# Patient Record
Sex: Male | Born: 1980 | Race: Black or African American | Hispanic: No | Marital: Single | Smoking: Current every day smoker
Health system: Southern US, Community
[De-identification: ages and names within clinical notes are randomized; demographics above are authoritative.]

## PROBLEM LIST (undated history)

## (undated) DIAGNOSIS — M25561 Pain in right knee: Secondary | ICD-10-CM

## (undated) DIAGNOSIS — G8929 Other chronic pain: Secondary | ICD-10-CM

---
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
## 2014-02-15 ENCOUNTER — Emergency Department (HOSPITAL_BASED_OUTPATIENT_CLINIC_OR_DEPARTMENT_OTHER): Payer: Self-pay

## 2014-02-15 ENCOUNTER — Encounter (HOSPITAL_BASED_OUTPATIENT_CLINIC_OR_DEPARTMENT_OTHER): Payer: Self-pay | Admitting: *Deleted

## 2014-02-15 ENCOUNTER — Emergency Department (HOSPITAL_BASED_OUTPATIENT_CLINIC_OR_DEPARTMENT_OTHER)
Admission: EM | Admit: 2014-02-15 | Discharge: 2014-02-15 | Disposition: A | Discharge status: Home / Self Care | Payer: Self-pay | Attending: Emergency Medicine | Admitting: Emergency Medicine

## 2014-02-15 DIAGNOSIS — M25561 Pain in right knee: Secondary | ICD-10-CM

## 2014-02-15 DIAGNOSIS — Y998 Other external cause status: Secondary | ICD-10-CM | POA: Insufficient documentation

## 2014-02-15 DIAGNOSIS — Z720 Tobacco use: Secondary | ICD-10-CM | POA: Insufficient documentation

## 2014-02-15 DIAGNOSIS — X58XXXA Exposure to other specified factors, initial encounter: Secondary | ICD-10-CM | POA: Insufficient documentation

## 2014-02-15 DIAGNOSIS — Y9289 Other specified places as the place of occurrence of the external cause: Secondary | ICD-10-CM | POA: Insufficient documentation

## 2014-02-15 DIAGNOSIS — S8391XA Sprain of unspecified site of right knee, initial encounter: Secondary | ICD-10-CM | POA: Insufficient documentation

## 2014-02-15 DIAGNOSIS — Y9389 Activity, other specified: Secondary | ICD-10-CM | POA: Insufficient documentation

## 2014-02-15 MED ORDER — HYDROCODONE-ACETAMINOPHEN 5-325 MG PO TABS: 1.0000 | ORAL_TABLET | Freq: Four times a day (QID) | ORAL | Status: DC | PRN

## 2014-02-15 MED ORDER — HYDROCODONE-ACETAMINOPHEN 5-325 MG PO TABS
2.0000 | ORAL_TABLET | Freq: Once | ORAL | Status: AC
  Administered 2014-02-15: 2 via ORAL
  Filled 2014-02-15: qty 2

## 2014-02-15 NOTE — ED Notes (Signed)
Pt sts he threw his right knee out of place this morning when stepping down out of a truck.

## 2014-02-15 NOTE — Discharge Instructions (Signed)
1. Medications: vicodin, usual home medications 2. Treatment: rest, drink plenty of fluids, ice, elevate, use crutches as needed, wear brace 3. Follow Up: Please followup with Dr. Pearletha ForgeHudnall in one week if symptoms persist for discussion of your diagnoses and further evaluation after today's visit; if you do not have a primary care doctor use the resource guide provided to find one; Please return to the ER for weakness, numbness, inability to bear weight on the leg.    Arthralgia Your caregiver has diagnosed you as suffering from an arthralgia. Arthralgia means there is pain in a joint. This can come from many reasons including:  Bruising the joint which causes soreness (inflammation) in the joint.  Wear and tear on the joints which occur as we grow older (osteoarthritis).  Overusing the joint.  Various forms of arthritis.  Infections of the joint. Regardless of the cause of pain in your joint, most of these different pains respond to anti-inflammatory drugs and rest. The exception to this is when a joint is infected, and these cases are treated with antibiotics, if it is a bacterial infection. HOME CARE INSTRUCTIONS   Rest the injured area for as long as directed by your caregiver. Then slowly start using the joint as directed by your caregiver and as the pain allows. Crutches as directed may be useful if the ankles, knees or hips are involved. If the knee was splinted or casted, continue use and care as directed. If an stretchy or elastic wrapping bandage has been applied today, it should be removed and re-applied every 3 to 4 hours. It should not be applied tightly, but firmly enough to keep swelling down. Watch toes and feet for swelling, bluish discoloration, coldness, numbness or excessive pain. If any of these problems (symptoms) occur, remove the ace bandage and re-apply more loosely. If these symptoms persist, contact your caregiver or return to this location.  For the first 24 hours,  keep the injured extremity elevated on pillows while lying down.  Apply ice for 15-20 minutes to the sore joint every couple hours while awake for the first half day. Then 03-04 times per day for the first 48 hours. Put the ice in a plastic bag and place a towel between the bag of ice and your skin.  Wear any splinting, casting, elastic bandage applications, or slings as instructed.  Only take over-the-counter or prescription medicines for pain, discomfort, or fever as directed by your caregiver. Do not use aspirin immediately after the injury unless instructed by your physician. Aspirin can cause increased bleeding and bruising of the tissues.  If you were given crutches, continue to use them as instructed and do not resume weight bearing on the sore joint until instructed. Persistent pain and inability to use the sore joint as directed for more than 2 to 3 days are warning signs indicating that you should see a caregiver for a follow-up visit as soon as possible. Initially, a hairline fracture (break in bone) may not be evident on X-rays. Persistent pain and swelling indicate that further evaluation, non-weight bearing or use of the joint (use of crutches or slings as instructed), or further X-rays are indicated. X-rays may sometimes not show a small fracture until a week or 10 days later. Make a follow-up appointment with your own caregiver or one to whom we have referred you. A radiologist (specialist in reading X-rays) may read your X-rays. Make sure you know how you are to obtain your X-ray results. Do not assume everything is  normal if you do not hear from [place]. SEEK MEDICAL CARE IF: Bruising, swelling, or pain increases. SEEK IMMEDIATE MEDICAL CARE IF:   Your fingers or toes are numb or blue.  The pain is not responding to medications and continues to stay the same or get worse.  The pain in your joint becomes severe.  You develop a fever over 102 F (38.9 C).  It becomes impossible to  move or use the joint. MAKE SURE YOU:   Understand these instructions.  Will watch your condition.  Will get help right away if you are not doing well or get worse. Document Released: 12/23/2004 Document Revised: 03/17/2011 Document Reviewed: 08/11/2007 [facility] Patient Information 2015 [place], [place]. This information is not intended to replace advice given to you by your health care provider. Make sure you discuss any questions you have with your health care provider.   Emergency Department Resource Guide 1) Find a Doctor and Pay Out of Pocket Although you won't have to find out who is covered by your insurance plan, it is a good idea to ask around and get recommendations. You will then need to call the office and see if the doctor you have chosen will accept you as a new patient and what types of options they offer for patients who are self-pay. Some doctors offer discounts or will set up payment plans for their patients who do not have insurance, but you will need to ask so you aren't surprised when you get to your appointment.  2) Contact Your Local Health Department Not all health departments have doctors that can see patients for sick visits, but many do, so it is worth a call to see if yours does. If you don't know where your local health department is, you can check in your phone book. The CDC also has a tool to help you locate your state's health department, and many state websites also have listings of all of their local health departments.  3) Find a Walk-in Clinic If your illness is not likely to be very severe or complicated, you may want to try a walk in clinic. These are popping up all over the country in pharmacies, drugstores, and shopping centers. They're usually staffed by nurse practitioners or physician assistants that have been trained to treat common illnesses and complaints. They're usually fairly quick and inexpensive. However, if you have serious medical issues or  chronic medical problems, these are probably not your best option.  No Primary Care Doctor: - Call Health Connect at  [phone] - they can help you locate a primary care doctor that  accepts your insurance, provides certain services, etc. - Physician Referral Service- [phone]  Chronic Pain Problems: Organization         Address  Phone   Notes  [facility]  [phone] Patients need to be referred by their primary care doctor.   Medication Assistance: Organization         Address  Phone   Notes  [facility] [address] [place], [place] [address] [phone] --Must be a resident of [facility] -- Must have NO insurance coverage whatsoever (no Medicaid/ Medicare, etc.) -- The pt. MUST have a primary care doctor that directs their care regularly and follows them in the community   [facility]  [phone]   [employer]  [phone]    Agencies that provide inexpensive medical care: Organization  Address  Phone   Notes  [facility]  [phone]   [facility]    [phone]   [facility] [address] [place], [place] [id number] [phone]   [facility] [address], [place] [phone]) [phone]   [facility]    [phone]   [facility]    [phone]   [facility]  [address], [place] Phone:  [phone], Fax:  [phone]) [phone] Hours of Operation:  9 am - 6 pm, M-F.  Also accepts Medicaid/Medicare and self-pay.  [facility]  [address], [place] Phone: [phone], Fax: [phone](336) [phone]. Hours of Operation:  8:30 am - 5:30 pm, M-F.  Also accepts Medicaid and self-pay.  [facility] [place] [address], [place] Phone: [phone]) [phone]   [facility] [address], [place], [place]  [phone], Ext. [phone] Mondays & Thursdays: 7-9 AM.  First 15 patients are seen on a first come, first serve basis.    Medicaid-accepting [facility] Providers:  Organization         Address  Phone   Notes  [facility] [address], [place] [phone] Also accepts self-pay patients.  [facility] [address], [place]  [phone]) [phone]   [facility] [address], [place] [phone]) [phone]   [facility] [address] [place] [address], [place] [phone]   Renaye RakersVeita Bland [address], [place]   [phone] Only accepts [place] Access [place] patients after they have their name applied to their card.   Self-Pay (no insurance) in [facility]:  Organization         Address  Phone   Notes  Sickle Cell Patients, [facility] [address], [place] [phone]   [facility] [address], [place] [phone]) [phone]   [facility] [place]  [address], [place] [phone]) [phone]   [facility]/Dr. Osei-Bonsu  [address] [place] [address], [place] or [phone], [place] [phone] Phone number for both [place] and [place] locations is the same.  [facility] [address], [place] [phone]   [facility] [address] [place] [address], [place] or [address] [phone]   [facility] [address], [place] [phone]) [phone], phone; [phone], fax Sees patients 1st and 3rd Saturday of every month.  Must not qualify for public or private insurance (i.e. Medicaid, Medicare, [place] Health Choice, Veterans' Benefits)  Household income should be no more than 200% of the poverty level The clinic cannot treat you if you are pregnant or think you are pregnant  Sexually transmitted  diseases are not treated at the clinic.    Dental Care: Organization         Address  Phone  Notes  [facility] of [facility] [address], [place] [phone] Accepts children up to age 34 who are enrolled in [place] or [place] Health Choice; pregnant women with a Medicaid card; and children who have applied for Medicaid or [place] Health Choice, but were declined, whose parents can pay a reduced fee at time  of service.  [facility] Department of [facility]  [address], [place] [phone] Accepts children up to age 66 who are enrolled in [place] or [place] Health Choice; pregnant women with a Medicaid card; and children who have applied for Medicaid or [place] Health Choice, but were declined, whose parents can pay a reduced fee at time of service.  [facility]  [address] [place], [place] [phone] Patients are seen by appointment only. Walk-ins are not accepted. [facility] will see patients 83 years of age and older. Monday - Tuesday (8am-5pm) Most Wednesdays (8:30-5pm) $30 per visit, cash only  [facility]  [address], [facility] [phone] Patients are seen by appointment only. Walk-ins are not accepted. [facility] will see patients 76 years of age and older. One Wednesday Evening (Monthly: Volunteer Based).  $30 per visit, cash only  [employer]  [phone] for adults; Children under age 7, call Graduate Pediatric Dentistry at [phone]. Children aged 64-14, please call [phone] to request a pediatric application.  Dental services are provided in all areas of dental care including fillings, crowns and bridges, complete and partial dentures, implants, gum treatment, root canals, and extractions. Preventive care is also provided. Treatment is provided to both adults and children. Patients are selected via a  lottery and there is often a waiting list.   [facility] [address], [place]  [phone] [email or web address]   [facility] [address] [place], [place] [phone], Ext. [phone] Second and Fourth Thursday of each month, opens at 6:30 AM; Clinic ends at 9 AM.  Patients are seen on a first-come first-served basis, and a limited number are seen during each clinic.   [facility]  [address] [place] [place], [place] [phone]   Eligibility Requirements You must have lived in [place], [place], or [place] counties for at least the last three months.   You cannot be eligible for state or federal sponsored [place], including [employer], [place], or [employer].   You generally cannot be eligible for healthcare insurance through your employer.    How to apply: Eligibility screenings are held every Tuesday and Wednesday afternoon from 1:00 pm until 4:00 pm. You do not need an appointment for the interview!  [facility] [address], [place], [place] [phone]   [facility] Health Department  [phone]   [facility] Health Department  [phone]   [facility] Health Department  [phone]    Behavioral Health Resources in the Community: Intensive Outpatient Programs Organization         Address  Phone  Notes  [facility] [address] [place], [place] [phone]   [facility] [address], [place], [place] [phone]   [facility] [address] [place], [place]  [phone]   [facility] [address]  [place], [place] [phone] or [phone]   Substance Abuse Resources Organization         Address  Phone  Notes  [facility]  [phone]   [facility]  [phone]   [facility] [place]  [phone]   Floydene Flock  [phone]   Residential &  Outpatient Substance Abuse Program  [phone]   Psychological Services Organization         Address  Phone  Notes  °[facility]  336- [phone]   °[facility]  336- [phone]   °[facility] [address], [place] 1-800-853-5163 or 336-641-4981   ° °Mobile Crisis Teams °Organization         Address  Phone  Notes  °[facility], Mobile Crisis Care Unit  1-877-626-1772   °Assertive °[facility]otherapeutic Services ° [address] [place], [place] [phone]   °Sharon DeEsch [address] °[place] [phone]   ° °Self-Help/Support Groups °Organization         Address  Phone             Notes  °[facility] [place] - variety of support groups  336- 373-1402 Call for more information  °Narcotics Anonymous (NA), [facility] [address], °[place]  2 meetings at this location  ° °Residential Treatment Programs °Organization         Address  Phone  Notes  °[facility] [address],    °[place]  1-866-801-8205   °[facility] ° [address], [place], [place] [phone]   °[facility] [address], [place] [phone] Admissions: 8am-3pm M-F  °[facility] [address]    °[place], [place] [phone]   °[facility] [address], [place], [place] [phone]   °[facility] [address]  °[place], [place] 336-285-9073   °[facility] - Intensive Outpatient [address], , [place] 336-852-3033   °[facility] [address]  °[place], [place] 1-877-615-2722 or 336-784-9470   °[facility] ([facility]) [address] [place], [place] 336-227-7417 Accepts Medicaid  °[facility] [address]  ° [place] 1-800-659-3381 Substance Abuse/Addiction Treatment  ° °[place] Behavioral Health Resources °Organization          Address  Phone  Notes  °[facility]  (888) [phone]   °Julie Brannon, PhD [address] [place], [place]   (336) [phone] or (336) 951-0000   °[facility]   [address] °[place], [place] (336) 349-4454   °[facility] [address], [place], [place] (336) [phone] Insurance/Medicaid/sponsorship through [facility]  °[facility] [address]                                    [place], [place] (336) [phone] Therapy/tele-psych/case  °[facility] [address]  ° [place], [place] (336) [phone]    °Dr. Arfeen  (336) 349-4544   °[facility] [place]  [employer] [place] [facility] 1) [address], [place] °2) [address], [place] °3)  [address], [place] (336) [phone] °(336) 342-7768 ° °(336) 342-8140   °[place] Child Abuse Hotline (336) 342-1394 or (336) 342-3537 (After Hours)    ° ° ° °

## 2014-02-15 NOTE — ED Provider Notes (Signed)
CSN: [id number]     Arrival date & time 02/15/14  1339 History   First MD Initiated Contact with Patient 02/15/14 1504     Chief Complaint  Patient presents with  . Knee Pain     (Consider location/radiation/quality/duration/timing/severity/associated sxs/prior Treatment) Patient is a 34 y.o. male presenting with knee pain. The history is provided by the patient and medical records. No language interpreter was used.  Knee Pain Associated symptoms: no back pain, no fever and no neck pain    Gardiner RhymeMustafa Laskowski is a 34 y.o. male  with a hx of no major medical problems presents to the Emergency Department complaining of acute, persistent right knee pain. He reports he stepped down out of a chronic and felt his right knee give way. He reports he had immediate pain in the medial side of the right knee occurring at approximately 6 AM this morning. He reports he has been able to ambulate on the knee today however pain has worsened throughout the day. He has taken ibuprofen without relief. Patient denies numbness or weakness in the right leg. He denies previous problems with the knee or previous knee surgery.  Rest makes the pain some better and ambulates in makes it worse. Patient denies fevers or chills, redness or erythema of the knee, nausea or vomiting.   History reviewed. No pertinent past medical history. History reviewed. No pertinent past surgical history. No family history on file. History  Substance Use Topics  . Smoking status: Current Every Day Smoker  . Smokeless tobacco: Not on file  . Alcohol Use: Yes    Review of Systems  Constitutional: Negative for fever and chills.  Gastrointestinal: Negative for nausea and vomiting.  Musculoskeletal: Positive for joint swelling and arthralgias. Negative for back pain, neck pain and neck stiffness.  Skin: Negative for wound.  Neurological: Negative for numbness.  Hematological: Does not bruise/bleed easily.  Psychiatric/Behavioral: The patient  is not nervous/anxious.   All other systems reviewed and are negative.     Allergies  Review of patient's allergies indicates no known allergies.  Home Medications   Prior to Admission medications   Medication Sig Start Date End Date Taking? Authorizing Provider  HYDROcodone-acetaminophen (NORCO/VICODIN) 5-325 MG per tablet Take 1-2 tablets by mouth every 6 (six) hours as needed for moderate pain or severe pain. 02/15/14   Idolina Mantell, PA-C   BP 111/63 mmHg  Pulse 63  Temp(Src) 98.9 F (37.2 C) (Oral)  Resp 18  Ht 6\' 2"  (1.88 m)  Wt 250 lb (113.399 kg)  BMI 32.08 kg/m2  SpO2 98% Physical Exam  Constitutional: He appears well-developed and well-nourished. No distress.  HENT:  Head: Normocephalic and atraumatic.  Eyes: Conjunctivae are normal.  Neck: Normal range of motion.  Cardiovascular: Normal rate, regular rhythm, normal heart sounds and intact distal pulses.   No murmur heard. Capillary refill < 3 sec  Pulmonary/Chest: Effort normal and breath sounds normal. No respiratory distress.  Musculoskeletal: He exhibits tenderness. He exhibits no edema.  ROM: Mildly restricted flexion with full extension Sinus to palpation of the medial joint line without swelling or ecchymosis No large joint effusion Negative Thompson's test No evidence of patellar tendon rupture Negative anterior and posterior drawer test  Neurological: He is alert. Coordination normal.  Sensation intact to dull and sharp throughout the entirety of the right lower leg including the parietal nerve distribution Strength 5/5 in the bilateral lower extremities including resisted flexion and extension Antalgic gait  Skin: Skin is warm and  dry. He is not diaphoretic.  No tenting of the skin  Psychiatric: He has a normal mood and affect.  Nursing note and vitals reviewed.   ED Course  Procedures (including critical care time) Labs Review Labs Reviewed - No data to display  Imaging Review Dg Knee  Complete 4 Views Right  02/15/2014   CLINICAL DATA:  34 year old male with acute right knee pain and swelling. Patient felt like his knee gave out when he stepped out of the truck earlier today.  EXAM: RIGHT KNEE - COMPLETE 4+ VIEW  COMPARISON:  None.  FINDINGS: There is no evidence of fracture, dislocation, or joint effusion. There is no evidence of arthropathy or other focal bone abnormality. Soft tissues are unremarkable.  IMPRESSION: Negative.   Electronically Signed   By: Malachy Moan M.D.   On: 02/15/2014 14:23     EKG Interpretation None      MDM   Final diagnoses:  Arthralgia of right knee  Right knee sprain, initial encounter   Gardiner Rhyme resents with right knee pain. Patient without evidence of dislocation or fracture on x-ray. No evidence of coronal nerve injury. Patient is able to ambulate though with pain. Strength is 5 out of 5. No large joint effusion, erythema or increased warmth to give rise to concern for septic joint.  No evidence of a tibial plateau fracture.  Patient X-Ray negative for obvious fracture or dislocation. Pain managed in ED. Pt advised to follow up with orthopedics if symptoms persist for possibility of missed fracture diagnosis. Patient given brace while in ED, conservative therapy recommended and discussed. Patient will be dc home & is agreeable with above plan.  I have personally reviewed patient's vitals, nursing note and any pertinent labs or imaging.  I performed an focused physical exam; undressed when appropriate .    It has been determined that no acute conditions requiring further emergency intervention are present at this time. The patient/guardian have been advised of the diagnosis and plan. I reviewed any labs and imaging including any potential incidental findings. We have discussed signs and symptoms that warrant return to the ED and they are listed in the discharge instructions.    Vital signs are stable at discharge.   BP 111/63 mmHg   Pulse 63  Temp(Src) 98.9 F (37.2 C) (Oral)  Resp 18  Ht  (1.88 m)  Wt 250 lb (113.399 kg)  BMI 32.08 kg/m2  SpO2 98%          Dierdre Forth, PA-C 02/15/14 1724  Audree Camel, MD 02/15/14 1920

## 2014-02-15 NOTE — ED Notes (Signed)
PA at bedside.

## 2015-01-02 ENCOUNTER — Emergency Department (HOSPITAL_BASED_OUTPATIENT_CLINIC_OR_DEPARTMENT_OTHER)
Admission: EM | Admit: 2015-01-02 | Discharge: 2015-01-03 | Disposition: A | Discharge status: Home / Self Care | Payer: 59 | Attending: Emergency Medicine | Admitting: Emergency Medicine

## 2015-01-02 ENCOUNTER — Encounter (HOSPITAL_BASED_OUTPATIENT_CLINIC_OR_DEPARTMENT_OTHER): Payer: Self-pay | Admitting: *Deleted

## 2015-01-02 DIAGNOSIS — F17210 Nicotine dependence, cigarettes, uncomplicated: Secondary | ICD-10-CM | POA: Insufficient documentation

## 2015-01-02 DIAGNOSIS — K529 Noninfective gastroenteritis and colitis, unspecified: Secondary | ICD-10-CM | POA: Insufficient documentation

## 2015-01-02 LAB — URINE MICROSCOPIC-ADD ON

## 2015-01-02 LAB — URINALYSIS, ROUTINE W REFLEX MICROSCOPIC
BILIRUBIN URINE: NEGATIVE
GLUCOSE, UA: NEGATIVE mg/dL
Ketones, ur: NEGATIVE mg/dL
Leukocytes, UA: NEGATIVE
Nitrite: NEGATIVE
PH: 5.5 (ref 5.0–8.0)
Protein, ur: NEGATIVE mg/dL
SPECIFIC GRAVITY, URINE: 1.026 (ref 1.005–1.030)

## 2015-01-02 NOTE — ED Notes (Signed)
Pt c/o n/v/d x 1 day; 

## 2015-01-03 LAB — CBC WITH DIFFERENTIAL/PLATELET
BASOS ABS: 0 10*3/uL (ref 0.0–0.1)
BASOS PCT: 0 %
EOS ABS: 0 10*3/uL (ref 0.0–0.7)
Eosinophils Relative: 0 %
HEMATOCRIT: 45.6 % (ref 39.0–52.0)
HEMOGLOBIN: 16.2 g/dL (ref 13.0–17.0)
Lymphocytes Relative: 8 %
Lymphs Abs: 0.9 10*3/uL (ref 0.7–4.0)
MCH: 30.7 pg (ref 26.0–34.0)
MCHC: 35.5 g/dL (ref 30.0–36.0)
MCV: 86.5 fL (ref 78.0–100.0)
Monocytes Absolute: 0.6 10*3/uL (ref 0.1–1.0)
Monocytes Relative: 5 %
NEUTROS ABS: 10.7 10*3/uL — AB (ref 1.7–7.7)
Neutrophils Relative %: 87 %
Platelets: 295 10*3/uL (ref 150–400)
RBC: 5.27 MIL/uL (ref 4.22–5.81)
RDW: 12.3 % (ref 11.5–15.5)
WBC: 12.2 10*3/uL — AB (ref 4.0–10.5)

## 2015-01-03 LAB — BASIC METABOLIC PANEL
ANION GAP: 6 (ref 5–15)
BUN: 12 mg/dL (ref 6–20)
CALCIUM: 8.9 mg/dL (ref 8.9–10.3)
CO2: 26 mmol/L (ref 22–32)
CREATININE: 1.07 mg/dL (ref 0.61–1.24)
Chloride: 104 mmol/L (ref 101–111)
Glucose, Bld: 113 mg/dL — ABNORMAL HIGH (ref 65–99)
Potassium: 4.3 mmol/L (ref 3.5–5.1)
SODIUM: 136 mmol/L (ref 135–145)

## 2015-01-03 MED ORDER — KETOROLAC TROMETHAMINE 30 MG/ML IJ SOLN
30.0000 mg | Freq: Once | INTRAMUSCULAR | Status: AC
  Administered 2015-01-03: 30 mg via INTRAVENOUS
  Filled 2015-01-03: qty 1

## 2015-01-03 MED ORDER — ONDANSETRON HCL 4 MG/2ML IJ SOLN
4.0000 mg | Freq: Once | INTRAMUSCULAR | Status: AC
  Administered 2015-01-03: 4 mg via INTRAVENOUS
  Filled 2015-01-03: qty 2

## 2015-01-03 MED ORDER — ONDANSETRON 8 MG PO TBDP: ORAL_TABLET | ORAL | Status: DC

## 2015-01-03 MED ORDER — SODIUM CHLORIDE 0.9 % IV BOLUS (SEPSIS)
1000.0000 mL | Freq: Once | INTRAVENOUS | Status: AC
  Administered 2015-01-03: 1000 mL via INTRAVENOUS

## 2015-01-03 NOTE — Discharge Instructions (Signed)
Zofran as prescribed as needed for nausea.  Clear liquid diet for the next 24 hours, then slowly advance to normal.  Return to the ER for severe abdominal pain, bloody stool, or other new and concerning symptoms.

## 2015-01-03 NOTE — ED Provider Notes (Signed)
CSN: [id number]     Arrival date & time 01/02/15  2217 History   First MD Initiated Contact with Patient 01/03/15 0051     Chief Complaint  Patient presents with  . Emesis     (Consider location/radiation/quality/duration/timing/severity/associated sxs/prior Treatment) HPI Comments: Patient is an otherwise healthy 34 year old male who presents with complaints of nausea, vomiting, diarrhea since this morning, all of which is been nonbloody. He reports feeling chilled. He does report abdominal cramping. He denies ill contacts.  Patient is a 34 y.o. male presenting with vomiting. The history is provided by the patient.  Emesis Severity:  Moderate Duration:  12 hours Timing:  Constant Progression:  Worsening Chronicity:  New Recent urination:  Normal Relieved by:  Nothing Worsened by:  Nothing tried Ineffective treatments:  None tried Associated symptoms: abdominal pain, chills and diarrhea     History reviewed. No pertinent past medical history. History reviewed. No pertinent past surgical history. History reviewed. No pertinent family history. Social History  Substance Use Topics  . Smoking status: Current Every Day Smoker    Types: Cigarettes  . Smokeless tobacco: None  . Alcohol Use: Yes    Review of Systems  Constitutional: Positive for chills.  Gastrointestinal: Positive for vomiting, abdominal pain and diarrhea.  All other systems reviewed and are negative.     Allergies  Review of patient's allergies indicates no known allergies.  Home Medications   Prior to Admission medications   Not on File   BP 113/82 mmHg  Pulse 97  Temp(Src) 99.6 F (37.6 C) (Oral)  Resp 16  Ht 6\' 2"  (1.88 m)  Wt 260 lb (117.935 kg)  BMI 33.37 kg/m2  SpO2 96% Physical Exam  Constitutional: He is oriented to person, place, and time. He appears well-developed and well-nourished. No distress.  HENT:  Head: Normocephalic and atraumatic.  Mouth/Throat: Oropharynx is clear and  moist.  Neck: Normal range of motion. Neck supple.  Cardiovascular: Normal rate, regular rhythm and normal heart sounds.   No murmur heard. Pulmonary/Chest: Effort normal and breath sounds normal. No respiratory distress. He has no wheezes. He has no rales.  Abdominal: Soft. Bowel sounds are normal. He exhibits no distension. There is no tenderness. There is no rebound.  Musculoskeletal: Normal range of motion. He exhibits no edema.  Neurological: He is alert and oriented to person, place, and time.  Skin: Skin is warm and dry. He is not diaphoretic.  Nursing note and vitals reviewed.   ED Course  Procedures (including critical care time) Labs Review Labs Reviewed  URINALYSIS, ROUTINE W REFLEX MICROSCOPIC (NOT [facility]) - Abnormal; Notable for the following:    Hgb urine dipstick MODERATE (*)    All other components within normal limits  URINE MICROSCOPIC-ADD ON - Abnormal; Notable for the following:    Squamous Epithelial / LPF 0-5 (*)    Bacteria, UA RARE (*)    All other components within normal limits  BASIC METABOLIC PANEL  CBC WITH DIFFERENTIAL/PLATELET    Imaging Review No results found. I have personally reviewed and evaluated these images and lab results as part of my medical decision-making.   EKG Interpretation None      MDM   Final diagnoses:  None    Patient's presentation, exam, and workup are all consistent with a viral gastroenteritis. He is feeling better after IV fluids and medications and will be discharged home with Zofran and when necessary return.    Geoffery Lyonsouglas Deep Bonawitz, MD 01/03/15 [phone]

## 2016-01-14 ENCOUNTER — Emergency Department (HOSPITAL_BASED_OUTPATIENT_CLINIC_OR_DEPARTMENT_OTHER)
Admission: EM | Admit: 2016-01-14 | Discharge: 2016-01-14 | Disposition: A | Discharge status: Home / Self Care | Payer: Self-pay | Attending: Emergency Medicine | Admitting: Emergency Medicine

## 2016-01-14 ENCOUNTER — Ambulatory Visit: Payer: Self-pay

## 2016-01-14 ENCOUNTER — Encounter (HOSPITAL_BASED_OUTPATIENT_CLINIC_OR_DEPARTMENT_OTHER): Payer: Self-pay | Admitting: Emergency Medicine

## 2016-01-14 DIAGNOSIS — M25561 Pain in right knee: Secondary | ICD-10-CM | POA: Insufficient documentation

## 2016-01-14 DIAGNOSIS — X501XXA Overexertion from prolonged static or awkward postures, initial encounter: Secondary | ICD-10-CM | POA: Insufficient documentation

## 2016-01-14 DIAGNOSIS — Y9367 Activity, basketball: Secondary | ICD-10-CM | POA: Insufficient documentation

## 2016-01-14 DIAGNOSIS — Y929 Unspecified place or not applicable: Secondary | ICD-10-CM | POA: Insufficient documentation

## 2016-01-14 DIAGNOSIS — F17210 Nicotine dependence, cigarettes, uncomplicated: Secondary | ICD-10-CM | POA: Insufficient documentation

## 2016-01-14 DIAGNOSIS — Y999 Unspecified external cause status: Secondary | ICD-10-CM | POA: Insufficient documentation

## 2016-01-14 NOTE — ED Notes (Signed)
ED Provider at bedside. 

## 2016-01-14 NOTE — ED Triage Notes (Signed)
Patient states that he "threw" his knee out on Sunday. The patient is walking with a limp

## 2016-01-14 NOTE — ED Provider Notes (Signed)
[facility]   CSN: [id number] Arrival date & time: 01/14/16  1508  By signing my name below, I, Joseph Murray, attest that this documentation has been prepared under the direction and in the presence of Joseph Murray, [place]. Electronically Signed: Linna Darnerussell Murray, Scribe. 01/14/2016. 5:35 PM.  History   Chief Complaint Chief Complaint  Patient presents with  . Knee Pain    The history is provided by the patient. No language interpreter was used.     HPI Comments: Joseph Murray is a 36 y.o. male who presents to the Emergency Department complaining of sudden onset, constant, aching, 4/10, right knee pain beginning 2 days ago. He reports associated swelling. He states he was playing basketball 2 days ago and landed awkwardly on his right leg. He states his right knee "buckled" upon landing and he experienced associated popping sensations. He has applied ice to his right knee and tried ibuprofen 800 mg with good relief of his pain and swelling. He endorses pain exacerbation with bending of his right knee and notes that extending his right knee improves his pain. He notes he has been ambulatory with a limp secondary to his right knee pain since onset. He states he injured his right knee several years ago and it consistently felt like it was out of place; he notes he was never evaluated for this and the pain eventually resolved on its own. He denies right ankle pain, fever, chills, nausea, vomiting, chest pain, SOB, or any other associated symptoms.  History reviewed. No pertinent past medical history.  There are no active problems to display for this patient.   History reviewed. No pertinent surgical history.     Home Medications    Prior to Admission medications   Medication Sig Start Date End Date Taking? Authorizing Provider  ondansetron (ZOFRAN ODT) 8 MG disintegrating tablet 8mg  ODT q4 hours prn nausea 01/03/15   Geoffery Lyonsouglas Delo, MD    Family  History History reviewed. No pertinent family history.  Social History Social History  Substance Use Topics  . Smoking status: Current Every Day Smoker    Types: Cigarettes  . Smokeless tobacco: Never Used  . Alcohol use Yes     Allergies   Patient has no known allergies.   Review of Systems Review of Systems  Constitutional: Negative for chills and fever.  Respiratory: Negative for shortness of breath.   Cardiovascular: Negative for chest pain.  Gastrointestinal: Negative for nausea and vomiting.  Musculoskeletal: Positive for arthralgias (right knee) and joint swelling (right knee).     Physical Exam Updated Vital Signs BP 124/92 (BP Location: Left Arm)   Pulse 68   Temp 98.1 F (36.7 C) (Oral)   Resp 18   Ht 6\' 2"  (1.88 m)   Wt 122 kg   SpO2 100%   BMI 34.54 kg/m   Physical Exam  Constitutional: He is oriented to person, place, and time. He appears well-developed and well-nourished. No distress.  HENT:  Head: Normocephalic and atraumatic.  Eyes: Conjunctivae and EOM are normal.  Neck: Neck supple. No tracheal deviation present.  Cardiovascular: Normal rate and intact distal pulses.   Distal pulses intact.  Pulmonary/Chest: Effort normal. No respiratory distress.  Musculoskeletal: Normal range of motion.       Right knee: He exhibits swelling. He exhibits normal range of motion, no ecchymosis, no deformity, no laceration, no erythema, normal alignment, no LCL laxity, normal patellar mobility and no MCL laxity. Tenderness found. Medial joint line and  lateral joint line tenderness noted.  Full ROM on active and passive ROM bilaterally. No pain with flexion or extension on active or passive ROM bilaterally. Right knee swelling and Mild TTP of right knee at medial and lateral joint line. No redness to right knee. No crepitus. Negative patellar ballottement test. No obvious effusion noted bilaterally. Negative anterior/poster drawer bilaterally. Negative Lachman's test  bilaterally. No varus or valgus laxity bilaterally.  Right ankle and foot not TTP, erythematous or evidence of swelling.   Neurological: He is alert and oriented to person, place, and time.  Right knee: strength 5/5 and sensation is intact.  Skin: Skin is warm and dry.  Psychiatric: He has a normal mood and affect. His behavior is normal.  Nursing note and vitals reviewed.    ED Treatments / Results  Labs (all labs ordered are listed, but only abnormal results are displayed) Labs Reviewed - No data to display  EKG  EKG Interpretation None       Radiology No results found.  Procedures Procedures (including critical care time)  DIAGNOSTIC STUDIES: Oxygen Saturation is 100% on RA, normal by my interpretation.    COORDINATION OF CARE: 5:45 PM Discussed treatment plan with pt at bedside and pt agreed to plan.  Medications Ordered in ED Medications - No data to display   Initial Impression / Assessment and Plan / ED Course  I have reviewed the triage vital signs and the nursing notes.  Pertinent labs & imaging results that were available during my care of the patient were reviewed by me and considered in my medical decision making (see chart for details).  Clinical Course   Patient does not meet [place] knee rules for acute knee injury and therefore no x-ray ordered. Discussed that with patient and he agreed with assessment. Pt afebrile, VSS, NAD. Low suspicion for septic arthritis, fracture, or dislocation at this time. Pt advised to follow up with orthopedics. Patient already had brace in Ed. Encouraged to keep using, conservative therapy recommended and discussed. Patient denied crutches. Patient will be discharged home & is agreeable with above plan. Returns precautions discussed. Pt appears safe for discharge.   Final Clinical Impressions(s) / ED Diagnoses   Final diagnoses:  Acute pain of right knee    New Prescriptions Discharge Medication List as of 01/14/2016   5:59 PM     I personally performed the services described in this documentation, which was scribed in my presence. The recorded information has been reviewed and is accurate.   [address] [place], [place] 01/15/16 1306    Doug Sou, MD 01/17/16 [phone]

## 2016-01-14 NOTE — Discharge Instructions (Signed)
Please continue taking ibuprofen as needed for swelling and pain. Continue icing your knee for 15 minutes 3 times a day. Continue movement of the knee throughout the day. Make sure to maintain proper gait throughout the day.  SEEK MEDICAL CARE IF: Your knee pain continues, changes, or gets worse. You have a fever along with knee pain. Your knee buckles or locks up. Your knee becomes more swollen. SEEK IMMEDIATE MEDICAL CARE IF:  Your knee joint feels hot to the touch. You have chest pain or trouble breathing.

## 2016-01-16 ENCOUNTER — Encounter (HOSPITAL_BASED_OUTPATIENT_CLINIC_OR_DEPARTMENT_OTHER): Payer: Self-pay | Admitting: *Deleted

## 2016-01-16 ENCOUNTER — Emergency Department (HOSPITAL_BASED_OUTPATIENT_CLINIC_OR_DEPARTMENT_OTHER)
Admission: EM | Admit: 2016-01-16 | Discharge: 2016-01-16 | Disposition: A | Discharge status: Home / Self Care | Payer: Self-pay | Attending: Emergency Medicine | Admitting: Emergency Medicine

## 2016-01-16 DIAGNOSIS — M25461 Effusion, right knee: Secondary | ICD-10-CM | POA: Insufficient documentation

## 2016-01-16 DIAGNOSIS — Z79899 Other long term (current) drug therapy: Secondary | ICD-10-CM | POA: Insufficient documentation

## 2016-01-16 DIAGNOSIS — F17210 Nicotine dependence, cigarettes, uncomplicated: Secondary | ICD-10-CM | POA: Insufficient documentation

## 2016-01-16 DIAGNOSIS — F1290 Cannabis use, unspecified, uncomplicated: Secondary | ICD-10-CM | POA: Insufficient documentation

## 2016-01-16 NOTE — Discharge Instructions (Signed)
Please read and follow all provided instructions.  Your diagnoses today include:  1. Knee effusion, right    Tests performed today include:  Vital signs. See below for your results today.   Medications prescribed:   None  Take any prescribed medications only as directed.  Home care instructions:   Follow any educational materials contained in this packet  Follow R.I.C.E. Protocol:  R - rest your injury   I  - use ice on injury without applying directly to skin  C - compress injury with bandage or splint  E - elevate the injury as much as possible  Follow-up instructions: Please follow-up with your primary care provider or the provided orthopedic physician (bone specialist) if you continue to have significant pain in 1 week. In this case you may have a more severe injury that requires further care.   Return instructions:   Please return if your toes or feet are numb or tingling, appear gray or blue, or you have severe pain (also elevate the leg and loosen splint or wrap if you were given one)  Please return to the Emergency Department if you experience worsening symptoms.   Please return if you have any other emergent concerns.  Additional Information:  Your vital signs today were: BP 128/89    Pulse 63    Temp 97.9 F (36.6 C)    Resp 18    Ht 6\' 2"  (1.88 m)    Wt 122 kg    SpO2 100%    BMI 34.54 kg/m  If your blood pressure (BP) was elevated above 135/85 this visit, please have this repeated by your doctor within one month. --------------

## 2016-01-16 NOTE — ED Notes (Signed)
ED Provider at bedside. 

## 2016-01-16 NOTE — ED Triage Notes (Signed)
Pt needs return to work w/o restrictions note

## 2016-01-16 NOTE — ED Provider Notes (Signed)
[facility]   CSN: [id number] Arrival date & time: 01/16/16  7829     History   Chief Complaint Chief Complaint  Patient presents with  . Follow-up    HPI Rece [name] is a 36 y.o. male.  Patient presents for recheck of his right knee injury. Patient seen on 01/14/16 after turning his knee while playing basketball. Patient has had associated medial pain and swelling. He has been ambulatory. No catching or locking of the knee. He was started on NSAIDs and previous ED visit. He states that his pain is gradually improved. He continues to have some swelling of the knee itself; no calf swelling. He is here today because he is requesting a work note without any restrictions so that he can return to work. He states that he can do lighter duty but the note needs to show no restrictions. The onset of this condition was acute. The course is improving. Aggravating factors: none. Alleviating factors: none.        History reviewed. No pertinent past medical history.  There are no active problems to display for this patient.   History reviewed. No pertinent surgical history.     Home Medications    Prior to Admission medications   Medication Sig Start Date End Date Taking? Authorizing Provider  ondansetron (ZOFRAN ODT) 8 MG disintegrating tablet 8mg  ODT q4 hours prn nausea 01/03/15   Geoffery Lyons, MD    Family History History reviewed. No pertinent family history.  Social History Social History  Substance Use Topics  . Smoking status: Current Every Day Smoker    Types: Cigarettes  . Smokeless tobacco: Never Used  . Alcohol use Yes     Allergies   Patient has no known allergies.   Review of Systems Review of Systems  Constitutional: Negative for activity change.  Musculoskeletal: Positive for arthralgias and joint swelling. Negative for back pain, gait problem and neck pain.  Skin: Negative for wound.  Neurological: Negative for weakness and  numbness.     Physical Exam Updated Vital Signs BP 128/89   Pulse 63   Temp 97.9 F (36.6 C)   Resp 18   Ht 6\' 2"  (1.88 m)   Wt 122 kg   SpO2 100%   BMI 34.54 kg/m   Physical Exam  Constitutional: He appears well-developed and well-nourished.  HENT:  Head: Normocephalic and atraumatic.  Eyes: Conjunctivae are normal.  Neck: Normal range of motion. Neck supple.  Pulmonary/Chest: No respiratory distress.  Musculoskeletal:       Right hip: Normal.       Right knee: He exhibits effusion. He exhibits normal range of motion and no erythema. Tenderness found. Medial joint line tenderness noted. No lateral joint line tenderness noted.       Right ankle: Normal.       Right upper leg: Normal.       Right lower leg: Normal.  Neurological: He is alert.  Skin: Skin is warm and dry.  Psychiatric: He has a normal mood and affect.  Nursing note and vitals reviewed.    ED Treatments / Results   Procedures Procedures (including critical care time)  Medications Ordered in ED Medications - No data to display   Initial Impression / Assessment and Plan / ED Course  I have reviewed the triage vital signs and the nursing notes.  Pertinent labs & imaging results that were available during my care of the patient were reviewed by me and considered in  my medical decision making (see chart for details).  Clinical Course    Patient seen and examined. Patient continues to have a small joint effusion. Overall, his symptoms are improving. Will give work note showing no restrictions, however I stressed to the patient that if any activity causes swelling to become worse or causes increasing pain that he needs to modify his activity is to not worsen his current situation. I have given him a referral to sports medicine to follow-up in one week if symptoms are persistent.  Vital signs reviewed and are as follows: BP 128/89   Pulse 63   Temp 97.9 F (36.6 C)   Resp 18   Ht 6\' 2"  (1.88 m)   Wt  122 kg   SpO2 100%   BMI 34.54 kg/m     Final Clinical Impressions(s) / ED Diagnoses   Final diagnoses:  Knee effusion, right   Patient with improving knee symptoms after injury, work note given with strict precautions and follow-up as above. LE is neurologically intact.   New Prescriptions Discharge Medication List as of 01/16/2016 10:00 AM       Renne CriglerJoshua Mendi Constable, PA-C 01/16/16 1006    Jacalyn LefevreJulie Haviland, MD 01/16/16 1024

## 2016-04-07 ENCOUNTER — Emergency Department (HOSPITAL_BASED_OUTPATIENT_CLINIC_OR_DEPARTMENT_OTHER)
Admission: EM | Admit: 2016-04-07 | Discharge: 2016-04-07 | Disposition: A | Discharge status: Home / Self Care | Payer: Self-pay | Attending: Emergency Medicine | Admitting: Emergency Medicine

## 2016-04-07 ENCOUNTER — Encounter (HOSPITAL_BASED_OUTPATIENT_CLINIC_OR_DEPARTMENT_OTHER): Payer: Self-pay

## 2016-04-07 DIAGNOSIS — M25561 Pain in right knee: Secondary | ICD-10-CM | POA: Insufficient documentation

## 2016-04-07 DIAGNOSIS — F17210 Nicotine dependence, cigarettes, uncomplicated: Secondary | ICD-10-CM | POA: Insufficient documentation

## 2016-04-07 DIAGNOSIS — G8929 Other chronic pain: Secondary | ICD-10-CM | POA: Insufficient documentation

## 2016-04-07 MED ORDER — IBUPROFEN 400 MG PO TABS
600.0000 mg | ORAL_TABLET | Freq: Once | ORAL | Status: AC
  Administered 2016-04-07: 600 mg via ORAL
  Filled 2016-04-07: qty 1

## 2016-04-07 NOTE — ED Provider Notes (Signed)
[facility]   CSN: [id number] Arrival date & time: 04/07/16  1533   By signing my name below, I, Talbert Nan, attest that this documentation has been prepared under the direction and in the presence of [place] Russo, PA-C. Electronically Signed: Talbert Nan, Scribe. 04/07/16. 4:49 PM.    History   Chief Complaint Chief Complaint  Patient presents with  . Knee Pain    HPI Joseph Murray is a 36 y.o. male who presents to the Emergency Department complaining of constant, moderate-severe, 7/10 severity, right knee pain that began 3 days ago. Localizes pain to anterio-medial aspect of knee w assoc swelling that improves w home remedy of vinegar and bread. Pt reports his knee pain is ongoing and flares up intermittently w more recent occurrence in  January 2018. He doesn't know the nature of the injury at that time. He is taking 800 mg Ibuprofen with moderate relief. Last dose of ibuprofen was at 10 am this morning He states that he has been 2 days out of work and needs a doctor note. Pt is able to ambulate, though with discomfort. Pt denies N/T, F/C.    The history is provided by the patient. No language interpreter was used.    History reviewed. No pertinent past medical history.  There are no active problems to display for this patient.   History reviewed. No pertinent surgical history.     Home Medications    Prior to Admission medications   Not on File    Family History No family history on file.  Social History Social History  Substance Use Topics  . Smoking status: Current Every Day Smoker    Types: Cigarettes  . Smokeless tobacco: Never Used  . Alcohol use Yes     Comment: occ     Allergies   Patient has no known allergies.   Review of Systems Review of Systems  Constitutional: Negative for chills and fever.  Respiratory: Negative for shortness of breath.   Cardiovascular: Negative for chest pain.  Musculoskeletal: Positive for  arthralgias (R knee) and joint swelling (R knee). Negative for myalgias.  Neurological: Negative for weakness and numbness.  All other systems reviewed and are negative.    Physical Exam Updated Vital Signs BP 124/89 (BP Location: Right Arm)   Pulse 65   Temp 99 F (37.2 C) (Oral)   Resp 16   Ht  (1.88 m)   Wt 117.9 kg   SpO2 100%   BMI 33.38 kg/m   Physical Exam  Constitutional: He is oriented to person, place, and time. He appears well-developed and well-nourished.  HENT:  Head: Normocephalic and atraumatic.  Eyes: Conjunctivae are normal.  Neck: Normal range of motion.  Cardiovascular: Normal rate and intact distal pulses.   Pulmonary/Chest: Effort normal.  Musculoskeletal:  R knee TTP anteromedial aspect just distal to joint line. Knee is stable. Mildly edematous, no erythema, no gross deformities, no palpable bakers cyst. AROM limited 2/t pain.  Neurological: He is alert and oriented to person, place, and time.  NV intact.  Skin: Skin is warm and dry.  Psychiatric: He has a normal mood and affect. His behavior is normal.  Nursing note and vitals reviewed.    ED Treatments / Results   DIAGNOSTIC STUDIES: Oxygen Saturation is 100% on room air, normal by my interpretation.    COORDINATION OF CARE: 4:46 PM Discussed treatment plan with pt at bedside and pt agreed to plan, which includes referral to a  PCP, which includes ice, heat, ibuprofen.    Labs (all labs ordered are listed, but only abnormal results are displayed) Labs Reviewed - No data to display  EKG  EKG Interpretation None       Radiology No results found.  Procedures Procedures (including critical care time)  Medications Ordered in ED Medications  ibuprofen (ADVIL,MOTRIN) tablet 600 mg (600 mg Oral Given 04/07/16 1708)    Initial Impression / Assessment and Plan / ED Course  I have reviewed the triage vital signs and the nursing notes.  Pertinent labs & imaging results that were  available during my care of the patient were reviewed by me and considered in my medical decision making (see chart for details).     Pt reports w chronic pain of R knee. States he missed work yesterday and today and requests work note. Pt is afebrile, able to ambulate, NV intact. No acute injury. Advil given in ED. Pt safe for discharge home. RICE therapy, advil or tylenol for pain, encouraged to establish PCP for follow up. Work note given for today.  Discussed results, findings, treatment and follow up. Patient advised of return precautions. Patient verbalized understanding and agreed with plan.   Final Clinical Impressions(s) / ED Diagnoses   Final diagnoses:  Chronic pain of right knee    New Prescriptions New Prescriptions   No medications on file   I personally performed the services described in this documentation, which was scribed in my presence. The recorded information has been reviewed and is accurate.     [place] N Russo, PA-C 04/07/16 1729    Lyndal Pulley, MD 04/08/16 [phone]

## 2016-04-07 NOTE — ED Triage Notes (Signed)
c/o pain,swelling to right knee-states is chronic and knows he needs surgery-NAD-stead gait

## 2016-04-07 NOTE — Discharge Instructions (Signed)
Please read instructions below. Follow up with your Primary Care Provider for your chronic pain. You can ice your knee for 20 minutes at a time and take Advil or Tylenol for pain.

## 2016-05-14 ENCOUNTER — Emergency Department (HOSPITAL_BASED_OUTPATIENT_CLINIC_OR_DEPARTMENT_OTHER)
Admission: EM | Admit: 2016-05-14 | Discharge: 2016-05-14 | Disposition: A | Discharge status: Home / Self Care | Payer: Self-pay | Attending: Emergency Medicine | Admitting: Emergency Medicine

## 2016-05-14 ENCOUNTER — Encounter (HOSPITAL_BASED_OUTPATIENT_CLINIC_OR_DEPARTMENT_OTHER): Payer: Self-pay

## 2016-05-14 DIAGNOSIS — F17210 Nicotine dependence, cigarettes, uncomplicated: Secondary | ICD-10-CM | POA: Insufficient documentation

## 2016-05-14 DIAGNOSIS — F1290 Cannabis use, unspecified, uncomplicated: Secondary | ICD-10-CM | POA: Insufficient documentation

## 2016-05-14 DIAGNOSIS — A084 Viral intestinal infection, unspecified: Secondary | ICD-10-CM | POA: Insufficient documentation

## 2016-05-14 HISTORY — DX: Pain in right knee: M25.561

## 2016-05-14 HISTORY — DX: Other chronic pain: G89.29

## 2016-05-14 MED ORDER — PROMETHAZINE HCL 25 MG PO TABS: 25.0000 mg | ORAL_TABLET | Freq: Four times a day (QID) | ORAL | 0 refills | Status: DC | PRN

## 2016-05-14 MED ORDER — ONDANSETRON 8 MG PO TBDP
ORAL_TABLET | ORAL | Status: AC
  Filled 2016-05-14: qty 1

## 2016-05-14 MED ORDER — ONDANSETRON 8 MG PO TBDP
8.0000 mg | ORAL_TABLET | Freq: Once | ORAL | Status: AC
  Administered 2016-05-14: 8 mg via ORAL

## 2016-05-14 NOTE — ED Provider Notes (Signed)
   [facility] Provider Note: Lowella DellJ. Lane Undrea Archbold, MD, FACEP  CSN: [id number] MRN: [id number] ARRIVAL: 05/14/16 at 2135 ROOM: [facility]   CHIEF COMPLAINT  Abdominal Pain   HISTORY OF PRESENT ILLNESS  Joseph Murray is a 36 y.o. male who developed nausea, vomiting and diarrhea yesterday evening about 6 PM. This awakened him from sleep. He is a third shift Financial controllerworker. There was associated abdominal cramping which she rated as a 9 out of 10 at its worst but this is improved. The diarrhea has subsided. He was given 8 milligrams of ODT Zofran on arrival with improvement in his nausea. 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 He does not know if he has had a fever.   Past Medical History:  Diagnosis Date  . Chronic pain of right knee     History reviewed. No pertinent surgical history.  No family history on file.  Social History  Substance Use Topics  . Smoking status: Current Every Day Smoker    Types: Cigarettes  . Smokeless tobacco: Never Used  . Alcohol use Yes     Comment: occ    Prior to Admission medications   Not on File    Allergies Patient has no known allergies.   REVIEW OF SYSTEMS  Negative except as noted here or in the History of Present Illness.   PHYSICAL EXAMINATION  Initial Vital Signs Blood pressure 131/83, pulse 72, temperature 98.5 F (36.9 C), temperature source Oral, resp. rate 18, height 6\' 2"  (1.88 m), weight 263 lb (119.3 kg), SpO2 98 %.  Examination General: Well-developed, well-nourished male in no acute distress; appearance consistent with age of record HENT: normocephalic; atraumatic Eyes: pupils equal, round and reactive to light; extraocular muscles intact Neck: supple Heart: regular rate and rhythm Lungs: clear to auscultation bilaterally Abdomen: soft; nondistended; mild epigastric tenderness; no masses or hepatosplenomegaly; bowel sounds present Extremities: No deformity; full range of motion Neurologic: Awake, alert and  oriented; motor function intact in all extremities and symmetric; no facial droop Skin: Warm and dry Psychiatric: Normal mood and affect   RESULTS  Summary of this visit's results, reviewed by myself:   EKG Interpretation  Date/Time:    Ventricular Rate:    PR Interval:    QRS Duration:   QT Interval:    QTC Calculation:   R Axis:     Text Interpretation:        Laboratory Studies: No results found for this or any previous visit (from the past 24 hour(s)). Imaging Studies: No results found.  ED COURSE  Nursing notes and initial vitals signs, including pulse oximetry, reviewed.  Vitals:   05/14/16 2144 05/14/16 2145  BP: 131/83   Pulse: 72   Resp: 18   Temp: 98.5 F (36.9 C)   TempSrc: Oral   SpO2: 98%   Weight:  263 lb (119.3 kg)  Height:  6\' 2"  (1.88 m)    PROCEDURES    ED DIAGNOSES     ICD-9-CM ICD-10-CM   1. Viral gastroenteritis 008.8 A08.4        Delaney Perona, MD 05/14/16 2311

## 2016-05-14 NOTE — ED Triage Notes (Signed)
c/o abd pain, n/v/d started 6pm-states "I know there's not much y"all can do but I need a work note"-NAD-steady gait

## 2016-07-24 ENCOUNTER — Emergency Department (HOSPITAL_BASED_OUTPATIENT_CLINIC_OR_DEPARTMENT_OTHER): Payer: BLUE CROSS/BLUE SHIELD

## 2016-07-24 ENCOUNTER — Emergency Department (HOSPITAL_BASED_OUTPATIENT_CLINIC_OR_DEPARTMENT_OTHER)
Admission: EM | Admit: 2016-07-24 | Discharge: 2016-07-24 | Disposition: A | Discharge status: Home / Self Care | Payer: BLUE CROSS/BLUE SHIELD | Attending: Emergency Medicine | Admitting: Emergency Medicine

## 2016-07-24 ENCOUNTER — Encounter (HOSPITAL_BASED_OUTPATIENT_CLINIC_OR_DEPARTMENT_OTHER): Payer: Self-pay

## 2016-07-24 DIAGNOSIS — R51 Headache: Secondary | ICD-10-CM | POA: Diagnosis present

## 2016-07-24 DIAGNOSIS — F17210 Nicotine dependence, cigarettes, uncomplicated: Secondary | ICD-10-CM | POA: Insufficient documentation

## 2016-07-24 DIAGNOSIS — R519 Headache, unspecified: Secondary | ICD-10-CM

## 2016-07-24 MED ORDER — IOPAMIDOL (ISOVUE-370) INJECTION 76%
100.0000 mL | Freq: Once | INTRAVENOUS | Status: AC | PRN
  Administered 2016-07-24: 100 mL via INTRAVENOUS

## 2016-07-24 MED ORDER — SODIUM CHLORIDE 0.9 % IV SOLN
1000.0000 mL | Freq: Once | INTRAVENOUS | Status: AC
  Administered 2016-07-24: 1000 mL via INTRAVENOUS

## 2016-07-24 MED ORDER — DEXAMETHASONE SODIUM PHOSPHATE 10 MG/ML IJ SOLN
10.0000 mg | Freq: Once | INTRAMUSCULAR | Status: AC
  Administered 2016-07-24: 10 mg via INTRAVENOUS
  Filled 2016-07-24: qty 1

## 2016-07-24 MED ORDER — DIPHENHYDRAMINE HCL 50 MG/ML IJ SOLN
50.0000 mg | Freq: Once | INTRAMUSCULAR | Status: AC
  Administered 2016-07-24: 50 mg via INTRAVENOUS
  Filled 2016-07-24: qty 1

## 2016-07-24 MED ORDER — METOCLOPRAMIDE HCL 5 MG/ML IJ SOLN
10.0000 mg | Freq: Once | INTRAMUSCULAR | Status: AC
  Administered 2016-07-24: 10 mg via INTRAVENOUS
  Filled 2016-07-24: qty 2
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
## 2016-07-24 NOTE — ED Notes (Signed)
ED Provider at bedside. 

## 2016-07-24 NOTE — Discharge Instructions (Signed)
Headache:  You are having a headache. No specific cause was found today for your headache. It may have been a migraine or other cause of headache. Stress, anxiety, fatigue, and depression are common triggers for headaches. Your headache today does not appear to be life-threatening or require hospitalization. Your imaging was negative for intracranial process and there were no problems with your arteries. Often the exact cause of headaches is not determined in the emergency department. Therefore, followup with your doctor is very important to find out what may have caused your headache, and whether or not you need any further diagnostic testing or treatment. Sometimes headaches can appear benign but then more serious symptoms can develop which should prompt an immediate reevaluation by your doctor or the emergency department.  Seek immediate medical attention if:  You develop possible problems with medications prescribed. The medications don't resolve your headache, if it recurs, or if you have multiple episodes of vomiting or can't take fluids by mouth You have a change from the usual headache. If you developed a sudden severe headache or confusion, become poorly responsive or faint, developed a fever above 100.4 or problems breathing, have a change in speech, vision, swallowing or understanding, or developed new weakness, numbness, tingling, incoordination or have a seizure.  If you don't have a family doctor to follow up with, see the follow up list below - call this morning for a follow-up appointment in the next 1-2 days.  RESOURCE GUIDE  Dental Problems  Patients with Medicaid: [facility]                     [facility] [id number]                                           [phone] Phone:  [phone]                                                  Phone:  [phone]  If unable to pay or uninsured, contact:  [facility] or [facility]  to become qualified for the adult dental clinic.  Chronic Pain Problems Contact [facility]  [phone] Patients need to be referred by their primary care doctor.  Insufficient Money for Medicine Contact [employer]:  call "211" or [facility] [phone] 692 1906.  No Primary Care Doctor Call [facility]  [phone] Other agencies that provide inexpensive medical care    [facility]  [phone]    [facility]  [phone]    [facility]  [phone]    [facility]  [phone]    [facility]  [phone]    [facility]  [phone]  Psychological Services [facility]  [phone] [facility]  [phone] [facility]   [phone] (emergency services [phone])  Substance Abuse Resources [facility]  [phone] [facility] [phone] [facility] [phone] [facility] [phone] Residential & Outpatient Substance Abuse Program  [phone]  Abuse/Neglect [facility] Hotline 201 048 8484(336) (304)682-6090 [facility] [phone] (After Hours)  Emergency Shelter [facility] [phone]) [phone]  [facility] at the  [facility] [place] [phone] [facility] [phone]  MRSA Hotline #:   571-677-1031    [facility] Resources  [facility]     [employer]                          [facility] [address] [place]                       [address]      [address]  Blondell Reveal Phone:  [phone]                                   Phone:  [phone]                 Phone:  [phone]  [facility] Phone:  [phone]  [facility] Child Abuse Hotline [phone] [phone] (After Hours)

## 2016-07-24 NOTE — ED Notes (Signed)
Pt verbalizes understanding of d/c instructions and denies any further needs at this time. 

## 2017-01-01 ENCOUNTER — Emergency Department (HOSPITAL_BASED_OUTPATIENT_CLINIC_OR_DEPARTMENT_OTHER)
Admission: EM | Admit: 2017-01-01 | Discharge: 2017-01-01 | Disposition: A | Discharge status: Home / Self Care | Payer: BLUE CROSS/BLUE SHIELD | Attending: Emergency Medicine | Admitting: Emergency Medicine

## 2017-01-01 ENCOUNTER — Encounter (HOSPITAL_BASED_OUTPATIENT_CLINIC_OR_DEPARTMENT_OTHER): Payer: Self-pay | Admitting: *Deleted

## 2017-01-01 DIAGNOSIS — F17210 Nicotine dependence, cigarettes, uncomplicated: Secondary | ICD-10-CM | POA: Diagnosis not present

## 2017-01-01 DIAGNOSIS — L0231 Cutaneous abscess of buttock: Secondary | ICD-10-CM | POA: Insufficient documentation

## 2017-01-01 MED ORDER — LIDOCAINE-EPINEPHRINE 2 %-1:100000 IJ SOLN
INTRAMUSCULAR | Status: AC
  Administered 2017-01-01: 10 mL
  Filled 2017-01-01: qty 1

## 2017-01-01 MED ORDER — HYDROCODONE-ACETAMINOPHEN 5-325 MG PO TABS: 2.0000 | ORAL_TABLET | ORAL | 0 refills | Status: AC | PRN

## 2017-01-01 NOTE — ED Provider Notes (Addendum)
[facility] Provider Note: Lowella DellJ. Lane Jullian Previti, MD, FACEP  CSN: [id number] MRN: [id number] ARRIVAL: 01/01/17 at 0341 ROOM: MH01/MH01   CHIEF COMPLAINT  Abscess   HISTORY OF PRESENT ILLNESS  01/01/17 4:01 AM Joseph Murray is a 36 y.o. male with a 4-day history of a tender swollen area in the middle of his left buttock.  It began draining purulent material yesterday.  He rates the associated pain as an 8 out of 10, worse with movement or sitting.  He has not had a fever or chills.  Consultation with the [facility] [place] controlled substances database reveals the patient has received no opioid prescriptions in the past year.   Past Medical History:  Diagnosis Date  . Chronic pain of right knee     History reviewed. No pertinent surgical history.  No family history on file.  Social History   Tobacco Use  . Smoking status: Current Every Day Smoker    Types: Cigarettes  . Smokeless tobacco: Never Used  Substance Use Topics  . Alcohol use: Yes    Comment: occ  . Drug use: No    Prior to Admission medications   Not on File    Allergies Patient has no known allergies.   REVIEW OF SYSTEMS  Negative except as noted here or in the History of Present Illness.   PHYSICAL EXAMINATION  Initial Vital Signs Blood pressure (!) 132/100, pulse 98, temperature 98.3 F (36.8 C), temperature source Oral, resp. rate 18, height 6\' 2"  (1.88 m), weight 117.9 kg (260 lb), SpO2 100 %.  Examination General: Well-developed, well-nourished male in no acute distress; appearance consistent with age of record HENT: normocephalic; atraumatic Eyes: pupils equal, round and reactive to light; extraocular muscles intact Neck: supple Heart: regular rate and rhythm Lungs: clear to auscultation bilaterally Abdomen: soft; nondistended; nontender; bowel sounds present Extremities: No deformity; full range of motion; pulses normal Neurologic: Awake, alert and oriented; motor function  intact in all extremities and symmetric; no facial droop Skin: Warm and dry; abscess with 2 draining openings mid left buttock Psychiatric: Normal mood and affect   RESULTS  Summary of this visit's results, reviewed by myself:   EKG Interpretation  Date/Time:    Ventricular Rate:    PR Interval:    QRS Duration:   QT Interval:    QTC Calculation:   R Axis:     Text Interpretation:        Laboratory Studies: No results found for this or any previous visit (from the past 24 hour(s)). Imaging Studies: No results found.  ED COURSE  Nursing notes and initial vitals signs, including pulse oximetry, reviewed.  Vitals:   01/01/17 0344  BP: (!) 132/100  Pulse: 98  Resp: 18  Temp: 98.3 F (36.8 C)  TempSrc: Oral  SpO2: 100%  Weight: 117.9 kg (260 lb)  Height: 6\' 2"  (1.88 m)    PROCEDURES   INCISION AND DRAINAGE Performed by: Paula LibraMOLPUS,Kyrin Gratz L Consent: Verbal consent obtained. Risks and benefits: risks, benefits and alternatives were discussed Type: abscess  Body area: Left buttock  Anesthesia: local infiltration  Incision was made with a scalpel.  Local anesthetic: lidocaine 2 % with epinephrine  Anesthetic total: 4 ml  Complexity: complex Blunt dissection to break up loculations  Drainage: Sanguinous and purulent  Drainage amount: Small  Packing material: 1/4 in iodoform gauze  Patient tolerance: Patient tolerated the procedure well with no immediate complications.  Patient advised to remove packing himself in 2-3 days if  symptoms are improving or to return to the ED if symptoms are worsening.   ED DIAGNOSES     ICD-10-CM   1. Abscess of left buttock L02.31        Joseph Murray, Joseph RuizJohn, MD 01/01/17 0414    Paula LibraMolpus, Brelee Renk, MD 01/01/17 [id number]    Paula LibraMolpus, Emika Tiano, MD 01/01/17 [phone]

## 2017-01-01 NOTE — ED Triage Notes (Signed)
C/o boil on left buttock x 3 days  States ruptured today

## 2017-05-05 ENCOUNTER — Encounter (HOSPITAL_BASED_OUTPATIENT_CLINIC_OR_DEPARTMENT_OTHER): Payer: Self-pay | Admitting: *Deleted

## 2017-05-05 ENCOUNTER — Emergency Department (HOSPITAL_BASED_OUTPATIENT_CLINIC_OR_DEPARTMENT_OTHER): Payer: BLUE CROSS/BLUE SHIELD

## 2017-05-05 ENCOUNTER — Emergency Department (HOSPITAL_BASED_OUTPATIENT_CLINIC_OR_DEPARTMENT_OTHER)
Admission: EM | Admit: 2017-05-05 | Discharge: 2017-05-05 | Disposition: A | Discharge status: Home / Self Care | Payer: BLUE CROSS/BLUE SHIELD | Attending: Emergency Medicine | Admitting: Emergency Medicine

## 2017-05-05 ENCOUNTER — Other Ambulatory Visit: Payer: Self-pay

## 2017-05-05 DIAGNOSIS — Z5321 Procedure and treatment not carried out due to patient leaving prior to being seen by health care provider: Secondary | ICD-10-CM | POA: Diagnosis not present

## 2017-05-05 DIAGNOSIS — M25562 Pain in left knee: Secondary | ICD-10-CM | POA: Insufficient documentation

## 2017-05-05 NOTE — ED Notes (Signed)
Pt not answering in waiting room for x-ray.

## 2017-05-05 NOTE — ED Triage Notes (Signed)
Pt c/o left knee injury x 3 hrs ago  
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
